# Patient Record
Sex: Female | Born: 2000 | Race: White | Hispanic: No | Marital: Single | Smoking: Never smoker
Health system: Southern US, Community
[De-identification: ages and names within clinical notes are randomized; demographics above are authoritative.]

## PROBLEM LIST (undated history)

## (undated) DIAGNOSIS — K5090 Crohn's disease, unspecified, without complications: Secondary | ICD-10-CM

---

## 2019-12-10 ENCOUNTER — Emergency Department (HOSPITAL_BASED_OUTPATIENT_CLINIC_OR_DEPARTMENT_OTHER)
Admission: EM | Admit: 2019-12-10 | Discharge: 2019-12-10 | Disposition: A | Discharge status: Home / Self Care | Payer: Self-pay | Attending: Emergency Medicine | Admitting: Emergency Medicine

## 2019-12-10 ENCOUNTER — Emergency Department (HOSPITAL_BASED_OUTPATIENT_CLINIC_OR_DEPARTMENT_OTHER): Payer: Self-pay

## 2019-12-10 ENCOUNTER — Other Ambulatory Visit: Payer: Self-pay

## 2019-12-10 ENCOUNTER — Encounter (HOSPITAL_BASED_OUTPATIENT_CLINIC_OR_DEPARTMENT_OTHER): Payer: Self-pay | Admitting: *Deleted

## 2019-12-10 DIAGNOSIS — N12 Tubulo-interstitial nephritis, not specified as acute or chronic: Secondary | ICD-10-CM | POA: Insufficient documentation

## 2019-12-10 HISTORY — DX: Crohn's disease, unspecified, without complications: K50.90

## 2019-12-10 LAB — COMPREHENSIVE METABOLIC PANEL
ALT: 17 U/L (ref 0–44)
AST: 18 U/L (ref 15–41)
Albumin: 4.7 g/dL (ref 3.5–5.0)
Alkaline Phosphatase: 61 U/L (ref 38–126)
Anion gap: 15 (ref 5–15)
BUN: 11 mg/dL (ref 6–20)
CO2: 24 mmol/L (ref 22–32)
Calcium: 9.6 mg/dL (ref 8.9–10.3)
Chloride: 97 mmol/L — ABNORMAL LOW (ref 98–111)
Creatinine, Ser: 0.8 mg/dL (ref 0.44–1.00)
GFR calc Af Amer: >60 mL/min (ref >60)
GFR calc non Af Amer: >60 mL/min (ref >60)
Glucose, Bld: 98 mg/dL (ref 70–99)
Potassium: 3.6 mmol/L (ref 3.5–5.1)
Sodium: 136 mmol/L (ref 135–145)
Total Bilirubin: 2 mg/dL — ABNORMAL HIGH (ref 0.3–1.2)
Total Protein: 9 g/dL — ABNORMAL HIGH (ref 6.5–8.1)

## 2019-12-10 LAB — CBC WITH DIFFERENTIAL/PLATELET
Abs Immature Granulocytes: 0.27 10*3/uL — ABNORMAL HIGH (ref 0.00–0.07)
Basophils Absolute: 0.1 10*3/uL (ref 0.0–0.1)
Basophils Relative: 0 %
Eosinophils Absolute: 0 10*3/uL (ref 0.0–0.5)
Eosinophils Relative: 0 %
HCT: 43.4 % (ref 36.0–46.0)
Hemoglobin: 14.7 g/dL (ref 12.0–15.0)
Immature Granulocytes: 1 %
Lymphocytes Relative: 2 %
Lymphs Abs: 0.6 10*3/uL — ABNORMAL LOW (ref 0.7–4.0)
MCH: 29.5 pg (ref 26.0–34.0)
MCHC: 33.9 g/dL (ref 30.0–36.0)
MCV: 87.1 fL (ref 80.0–100.0)
Monocytes Absolute: 1.5 10*3/uL — ABNORMAL HIGH (ref 0.1–1.0)
Monocytes Relative: 6 %
Neutro Abs: 22.7 10*3/uL — ABNORMAL HIGH (ref 1.7–7.7)
Neutrophils Relative %: 91 %
Platelets: 264 10*3/uL (ref 150–400)
RBC: 4.98 MIL/uL (ref 3.87–5.11)
RDW: 11.9 % (ref 11.5–15.5)
Smear Review: NORMAL
WBC: 25.1 10*3/uL — ABNORMAL HIGH (ref 4.0–10.5)
nRBC: 0 % (ref 0.0–0.2)

## 2019-12-10 LAB — URINALYSIS, ROUTINE W REFLEX MICROSCOPIC
Bilirubin Urine: NEGATIVE
Glucose, UA: NEGATIVE mg/dL
Ketones, ur: >80 mg/dL — AB
Nitrite: POSITIVE — AB
Protein, ur: 30 mg/dL — AB
Specific Gravity, Urine: 1.015 (ref 1.005–1.030)
pH: 5.5 (ref 5.0–8.0)

## 2019-12-10 LAB — URINALYSIS, MICROSCOPIC (REFLEX): WBC, UA: >50 WBC/hpf (ref 0–5)

## 2019-12-10 LAB — RAPID URINE DRUG SCREEN, HOSP PERFORMED
Amphetamines: NOT DETECTED
Barbiturates: NOT DETECTED
Benzodiazepines: NOT DETECTED
Cocaine: NOT DETECTED
Opiates: NOT DETECTED
Tetrahydrocannabinol: POSITIVE — AB

## 2019-12-10 LAB — PREGNANCY, URINE: Preg Test, Ur: NEGATIVE

## 2019-12-10 MED ORDER — ONDANSETRON HCL 4 MG/2ML IJ SOLN
4.0000 mg | Freq: Once | INTRAMUSCULAR | Status: AC
  Administered 2019-12-10: 4 mg via INTRAVENOUS
  Filled 2019-12-10: qty 2

## 2019-12-10 MED ORDER — CEPHALEXIN 500 MG PO CAPS: 500.0000 mg | ORAL_CAPSULE | Freq: Three times a day (TID) | ORAL | 0 refills | Status: AC

## 2019-12-10 MED ORDER — HALOPERIDOL LACTATE 5 MG/ML IJ SOLN
5.0000 mg | Freq: Once | INTRAMUSCULAR | Status: AC
  Administered 2019-12-10: 5 mg via INTRAVENOUS
  Filled 2019-12-10: qty 1

## 2019-12-10 MED ORDER — SODIUM CHLORIDE 0.9 % IV SOLN
1.0000 g | Freq: Once | INTRAVENOUS | Status: AC
  Administered 2019-12-10: 1 g via INTRAVENOUS
  Filled 2019-12-10: qty 10

## 2019-12-10 MED ORDER — IOHEXOL 300 MG/ML  SOLN
100.0000 mL | Freq: Once | INTRAMUSCULAR | Status: AC | PRN
  Administered 2019-12-10: 100 mL via INTRAVENOUS

## 2019-12-10 MED ORDER — SODIUM CHLORIDE 0.9 % IV BOLUS
1000.0000 mL | Freq: Once | INTRAVENOUS | Status: AC
  Administered 2019-12-10: 1000 mL via INTRAVENOUS

## 2019-12-10 MED ORDER — ONDANSETRON 4 MG PO TBDP: 4.0000 mg | ORAL_TABLET | Freq: Three times a day (TID) | ORAL | 0 refills | Status: AC | PRN

## 2019-12-10 MED FILL — CEPHALEXIN 500 MG CAPSULE: 500 | 7 days supply | Qty: 21 | Fill #0

## 2019-12-10 MED FILL — ONDANSETRON ODT 4 MG TABLET: 4 | 7 days supply | Qty: 20 | Fill #0

## 2019-12-10 NOTE — ED Notes (Signed)
Pt received 100 mcg of fentanyl iv pta

## 2019-12-10 NOTE — ED Provider Notes (Signed)
[facility] EMERGENCY DEPARTMENT Provider Note  CSN: [id number] Arrival date & time: 12/10/19 0630    History Chief Complaint  Patient presents with  . Abdominal Pain  . Nausea  . Vomiting    HPI  Rebecca Todd is a 19 y.o. female with a reported history of crohn's disease, not currently taking any medications for same, reports 3 days of sharp abdominal pain and persistent vomiting. She went to [facility] ED last night where labs were drawn but she left prior to seeing a provider. She went to a different ED, she isn't sure which, but left before anything was done there. Symptoms have continued through the night. She is from [place] and has had several previous visits there for same over the last couple of years, CT scans done there have not demonstrated findings consistent with Crohn's. She admits to frequent marijuana use.    Past Medical History:  Diagnosis Date  . Crohn's disease (HCC)     History reviewed. No pertinent surgical history.  History reviewed. No pertinent family history.  Social History   Tobacco Use  . Smoking status: Never Smoker  . Smokeless tobacco: Never Used  Vaping Use  . Vaping Use: Every day  Substance Use Topics  . Alcohol use: Never  . Drug use: Yes    Types: Marijuana    Comment: last used-yesterday     Home Medications Prior to Admission medications   Medication Sig Start Date End Date Taking? Authorizing Provider  cephALEXin (KEFLEX) 500 MG capsule Take 1 capsule (500 mg total) by mouth 3 (three) times daily for 7 days. 12/10/19 12/17/19  Pollyann Savoy, MD  ondansetron (ZOFRAN ODT) 4 MG disintegrating tablet Take 1 tablet (4 mg total) by mouth every 8 (eight) hours as needed for nausea or vomiting. 12/10/19   Pollyann Savoy, MD     Allergies    Penicillins   Review of Systems   Review of Systems A comprehensive review of systems was completed and negative except as noted in HPI.    Physical Exam BP 114/86 (BP  Location: Left Arm)   Pulse (!) 108   Temp 98.1 F (36.7 C) (Oral)   Resp 17   Ht 5' (1.524 m)   Wt 61.2 kg   LMP 12/03/2019   SpO2 96%   BMI 26.37 kg/m   Physical Exam Vitals and nursing note reviewed.  Constitutional:      Appearance: Normal appearance.  HENT:     Head: Normocephalic and atraumatic.     Nose: Nose normal.     Mouth/Throat:     Mouth: Mucous membranes are moist.  Eyes:     Extraocular Movements: Extraocular movements intact.     Conjunctiva/sclera: Conjunctivae normal.  Cardiovascular:     Rate and Rhythm: Normal rate.  Pulmonary:     Effort: Pulmonary effort is normal.     Breath sounds: Normal breath sounds.  Abdominal:     General: Abdomen is flat.     Palpations: Abdomen is soft.     Tenderness: There is generalized abdominal tenderness. There is no guarding. Negative signs include Murphy's sign and McBurney's sign.  Musculoskeletal:        General: No swelling. Normal range of motion.     Cervical back: Neck supple.  Skin:    General: Skin is warm and dry.  Neurological:     General: No focal deficit present.     Mental Status: She is alert.  Psychiatric:  Mood and Affect: Mood normal.      ED Results / Procedures / Treatments   Labs (all labs ordered are listed, but only abnormal results are displayed) Labs Reviewed  COMPREHENSIVE METABOLIC PANEL - Abnormal; Notable for the following components:      Result Value   Chloride 97 (*)    Total Protein 9.0 (*)    Total Bilirubin 2.0 (*)    All other components within normal limits  CBC WITH DIFFERENTIAL/PLATELET - Abnormal; Notable for the following components:   WBC 25.1 (*)    Neutro Abs 22.7 (*)    Lymphs Abs 0.6 (*)    Monocytes Absolute 1.5 (*)    Abs Immature Granulocytes 0.27 (*)    All other components within normal limits  URINALYSIS, ROUTINE W REFLEX MICROSCOPIC - Abnormal; Notable for the following components:   APPearance HAZY (*)    Hgb urine dipstick SMALL (*)      Ketones, ur >80 (*)    Protein, ur 30 (*)    Nitrite POSITIVE (*)    Leukocytes,Ua MODERATE (*)    All other components within normal limits  RAPID URINE DRUG SCREEN, HOSP PERFORMED - Abnormal; Notable for the following components:   Tetrahydrocannabinol POSITIVE (*)    All other components within normal limits  URINALYSIS, MICROSCOPIC (REFLEX) - Abnormal; Notable for the following components:   Bacteria, UA MANY (*)    All other components within normal limits  URINE CULTURE  PREGNANCY, URINE    EKG None   Radiology CT Abdomen Pelvis W Contrast  Result Date: 12/10/2019 CLINICAL DATA:  Right lower quadrant pain, history of Crohn's EXAM: CT ABDOMEN AND PELVIS WITH CONTRAST TECHNIQUE: Multidetector CT imaging of the abdomen and pelvis was performed using the standard protocol following bolus administration of intravenous contrast. CONTRAST:  OMNIPAQUE IOHEXOL 300 MG/ML  SOLN COMPARISON:  None. FINDINGS: Lower chest: No acute abnormality. Hepatobiliary: No focal liver abnormality is seen. No gallstones, gallbladder wall thickening, or biliary dilatation. Pancreas: Unremarkable. Spleen: Unremarkable. Adrenals/Urinary Tract: Adrenals are unremarkable. There is a wedge-shaped area of hypoenhancement at the upper pole the right kidney. Additional smaller area is likely present at the lower pole. Left kidney appears unremarkable. Mild right ureteral wall enhancement and slight prominence. Mild circumferential bladder wall thickening. There is a urachal remnant or sinus. Stomach/Bowel: Bowel is normal in caliber. Mild wall thickening along the terminal ileum and segments of the colon may be related to reported history of Crohn's or under distension. There is no significant surrounding inflammatory change. Normal appendix. Vascular/Lymphatic: No significant vascular abnormality. No enlarged lymph nodes identified. Reproductive: Uterus and bilateral adnexa are unremarkable. Other: No ascites.   Abdominal wall is unremarkable. Musculoskeletal: No significant or acute osseous finding. IMPRESSION: Areas of abnormal enhancement of the right kidney suspicious for pyelonephritis. Mild right ureteral wall enhancement and slight dilatation likely reflecting ascending infection. There is no obstructing calculus. Mild bladder wall thickening may be on the basis of underdistention or cystitis. A urachal remnant or sinus tract is incidentally noted. Electronically Signed   By: Guadlupe Spanish M.D.   On: 12/10/2019 13:21    Procedures Procedures  Medications Ordered in the ED Medications  ondansetron (ZOFRAN) injection 4 mg (4 mg Intravenous Given 12/10/19 0927)  sodium chloride 0.9 % bolus 1,000 mL (0 mLs Intravenous Stopped 12/10/19 1204)  haloperidol lactate (HALDOL) injection 5 mg (5 mg Intravenous Given 12/10/19 1048)  iohexol (OMNIPAQUE) 300 MG/ML solution 100 mL (100 mLs Intravenous Contrast Given  12/10/19 1255)  cefTRIAXone (ROCEPHIN) 1 g in sodium chloride 0.9 % 100 mL IVPB (1 g Intravenous New Bag/Given 12/10/19 1407)     MDM Rules/Calculators/A&P MDM Will check labs here, suspect Cannabinoid hyperemesis. Haldol for nausea. IVF ordered.  ED Course  I have reviewed the triage vital signs and the nursing notes.  Pertinent labs & imaging results that were available during my care of the patient were reviewed by me and considered in my medical decision making (see chart for details).  Clinical Course as of Dec 09 1453  Fri Dec 10, 2019  1100 WBC is elevated, was 17 at [facility] yesterday. Will need CT imaging to rule out infectious cause.    [CS]  1115 CMP with mildly elevated bilirubin, otherwise unremarkable.    [CS]  1338 UA consistent with infection. CT shows pyelonephritis without signs of other infection. Patient reports she is feeling better, no further nausea. She is sleepy from the Haldol but otherwise feels like she would OK to go home. She will be given a dose of Rocephin and if still  feeling OK, plan discharge with Rx for Keflex pending urine culture.    [CS]    Clinical Course User Index [CS] Pollyann Savoy, MD    Final Clinical Impression(s) / ED Diagnoses Final diagnoses:  Pyelonephritis    Rx / DC Orders ED Discharge Orders         Ordered    cephALEXin (KEFLEX) 500 MG capsule  3 times daily        12/10/19 1453    ondansetron (ZOFRAN ODT) 4 MG disintegrating tablet  Every 8 hours PRN        12/10/19 1453           Pollyann Savoy, MD 12/10/19 1455

## 2019-12-10 NOTE — ED Triage Notes (Signed)
Abdominal pain, nausea and vomiting for 3 days

## 2019-12-10 NOTE — ED Notes (Signed)
ED Provider at bedside. 

## 2019-12-12 LAB — URINE CULTURE: Culture: >=100000 — AB

## 2019-12-13 ENCOUNTER — Telehealth: Payer: Self-pay | Admitting: Emergency Medicine

## 2019-12-13 NOTE — Telephone Encounter (Signed)
Post ED Visit - Positive Culture Follow-up  Culture report reviewed by antimicrobial stewardship pharmacist: [facility] []  , Pharm.D. []  Enzo Bi, .D., BCPS AQ-ID []  Celedonio Miyamoto, Pharm.D., BCPS []  [address], Pharm.D., BCPS []  [place], Garvin Fila.D., BCPS, AAHIVP []  , Pharm.D., BCPS, AAHIVP []  Georgina Pillion, PharmD, BCPS []  , PharmD, BCPS []  [place], PharmD, BCPS []  [address], PharmD []  , PharmD, BCPS []  Estella Husk, PharmD PharmD  [facility] []  , PharmD []  Phillips Climes, PharmD []  , PharmD []  Agapito Games, Rph []  ) Verlan Friends, PharmD []  , PharmD []  Mervyn Gay, PharmD []  , PharmD []  Vinnie Level, PharmD []  Sharen Hones, PharmD []  [facility], PharmD []  , PharmD []  Len Childs, PharmD   Positive urine culture Treated with cephalexin, organism sensitive to the same and no further patient follow-up is required at this time.  12/13/2019, 12:06 PM

## 2019-12-14 MED FILL — CIPROFLOXACIN HCL 500 MG TA: 500 | 7 days supply | Qty: 14 | Fill #0

## 2019-12-14 MED FILL — ONDANSETRON ODT 4 MG TABLET: 4 | 7 days supply | Qty: 20 | Fill #0

## 2019-12-14 NOTE — ED Provider Notes (Signed)
Patient seen on Friday.  Was diagnosed with pyelonephritis.  Received 1 g of Rocephin.  Was supposed to be taking Keflex to follow through with treatment.  Patient shows up at the pharmacy today.  Does not want to take Keflex did not get it filled came to get that filled today.  However she is concerned because of a previous penicillin allergy that she cannot take the Keflex.  Reviewed the urine culture.  E. coli sensitive to Cipro as well.  We will treat the patient with Cipro.  Pregnancy test was negative.  Prescription changed to Cipro 500 mg twice a day for 7 days.  7 days should be adequate at this point in time.   Vanetta Mulders, MD 12/14/19 1755

## 2021-09-10 IMAGING — CT CT ABD-PELV W/ CM
2 of 4 series · 16 of 46 positions shown, 18 images · IV contrast (Omnipaque)
Comparison: None.

CLINICAL DATA: Right lower quadrant pain, history of Crohn's

EXAM:
CT ABDOMEN AND PELVIS WITH CONTRAST
TECHNIQUE: Multidetector CT imaging of the abdomen and pelvis was performed
using the standard protocol following bolus administration of
intravenous contrast.
CONTRAST:  100mL OMNIPAQUE IOHEXOL 300 MG/ML  SOLN

[Series 2: axial st · axial · 0.96mm/px · z∈[-443,-18]mm · 13 of 93 slices shown, 15 images]
[im 4/93  soft-tissue]
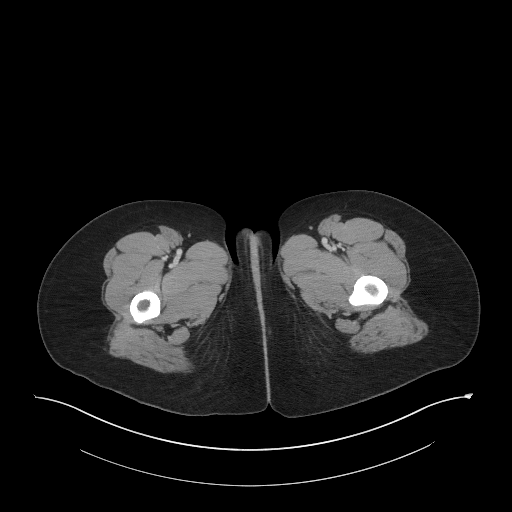
[im 4/93  bone]
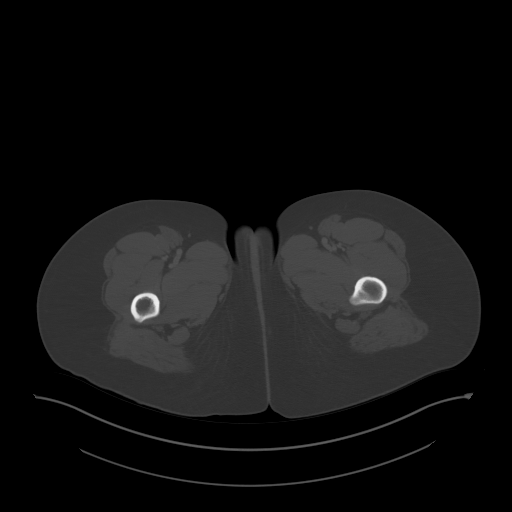
[im 12/93  soft-tissue]
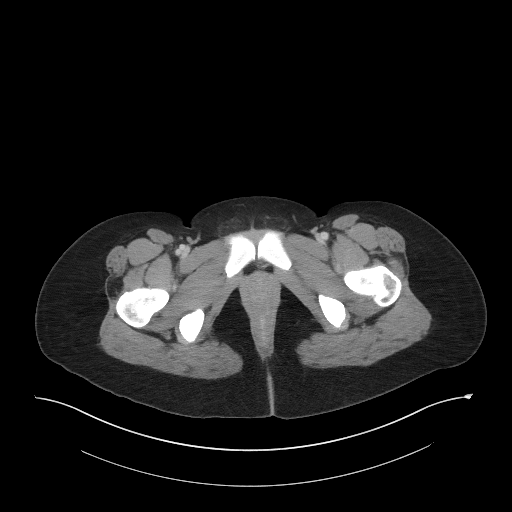
[im 19/93  soft-tissue]
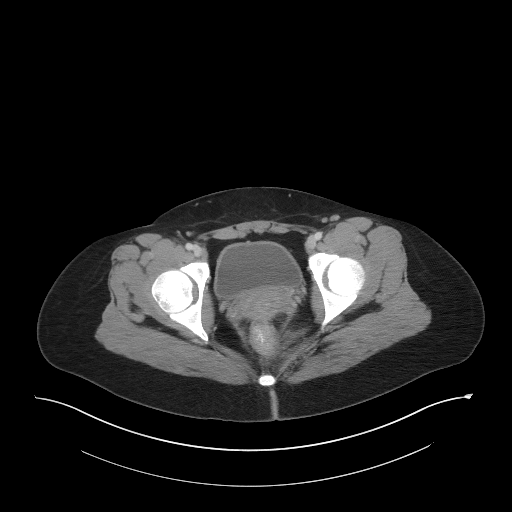
[im 26/93  soft-tissue]
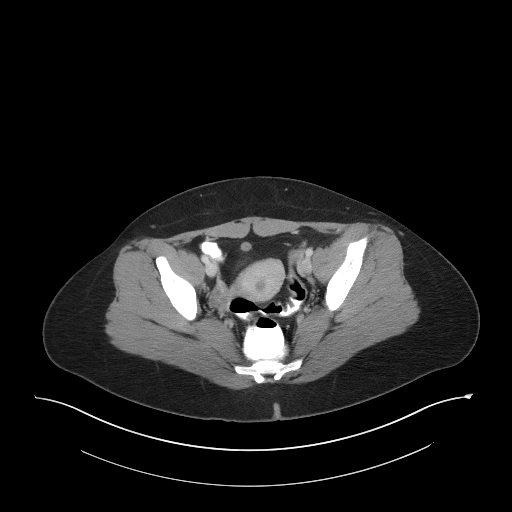
[im 34/93  soft-tissue]
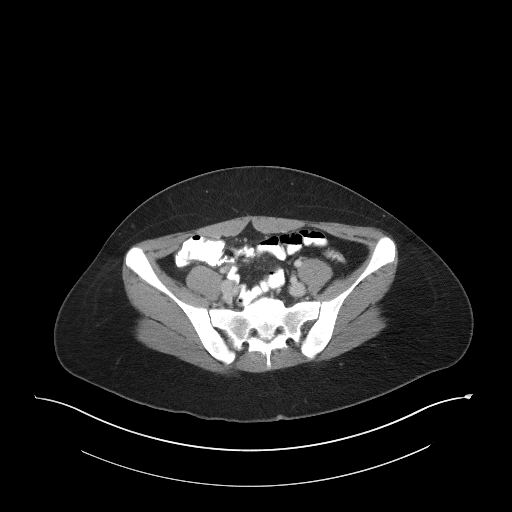
[im 41/93  soft-tissue]
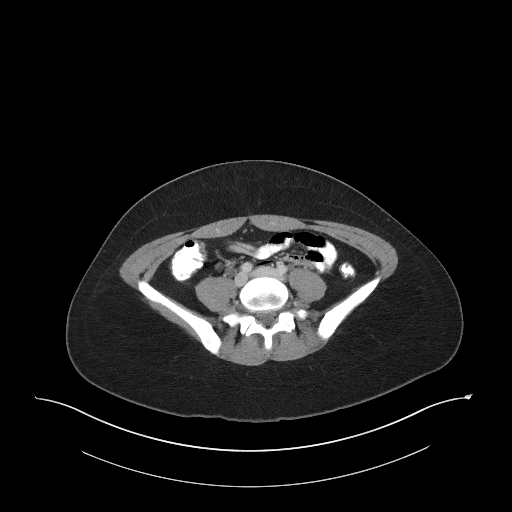
[im 48/93  soft-tissue]
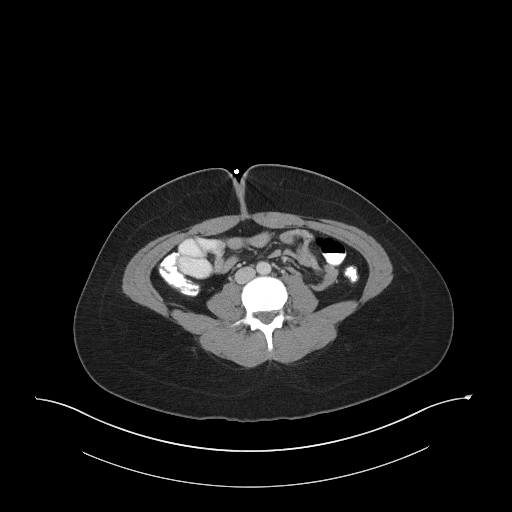
[im 52/93  soft-tissue]
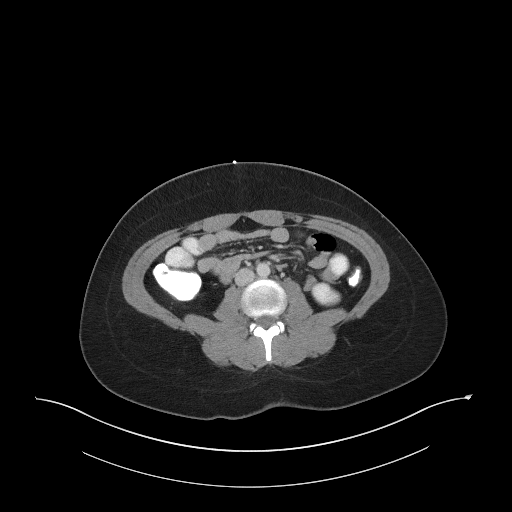
[im 59/93  soft-tissue]
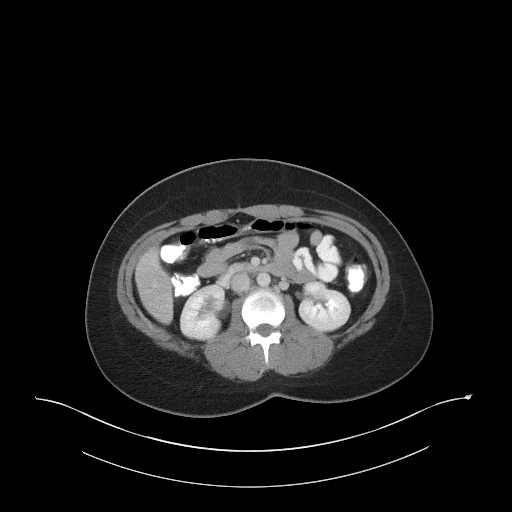
[im 59/93  bone]
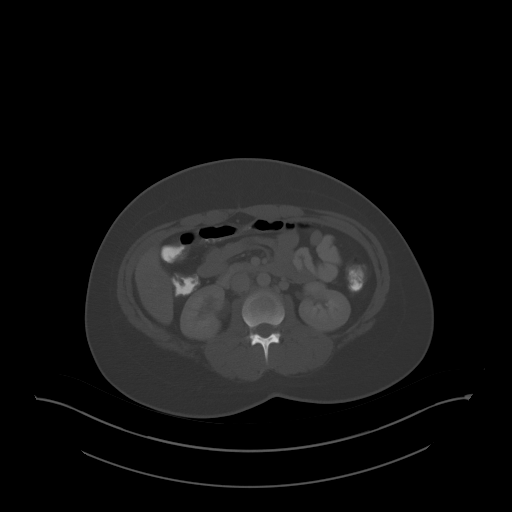
[im 67/93  soft-tissue]
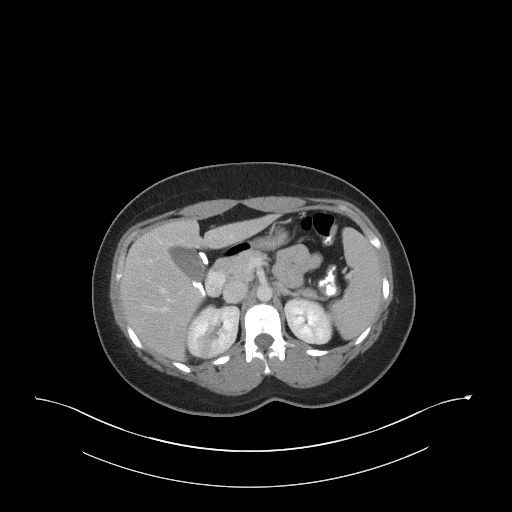
[im 74/93  soft-tissue]
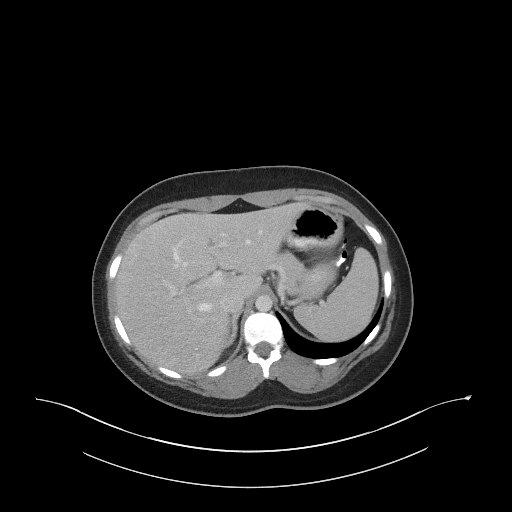
[im 81/93  soft-tissue]
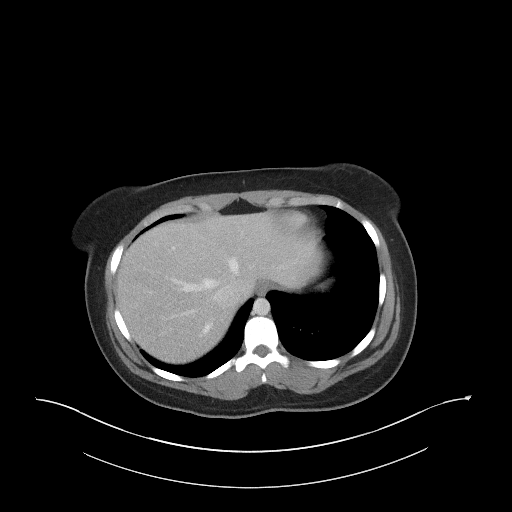
[im 89/93  soft-tissue]
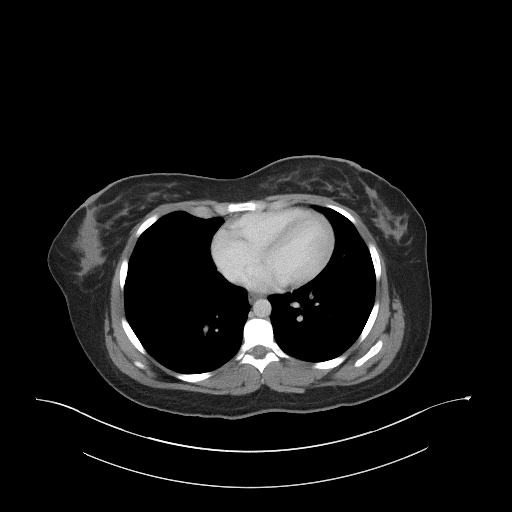

[Series 5: coronal st · coronal · 0.78mm/px · 3 of 83 slices shown]
[im 28/83  soft-tissue]
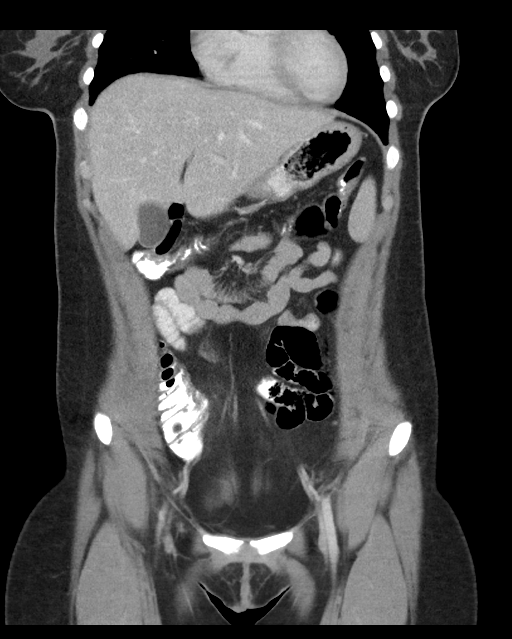
[im 37/83  soft-tissue]
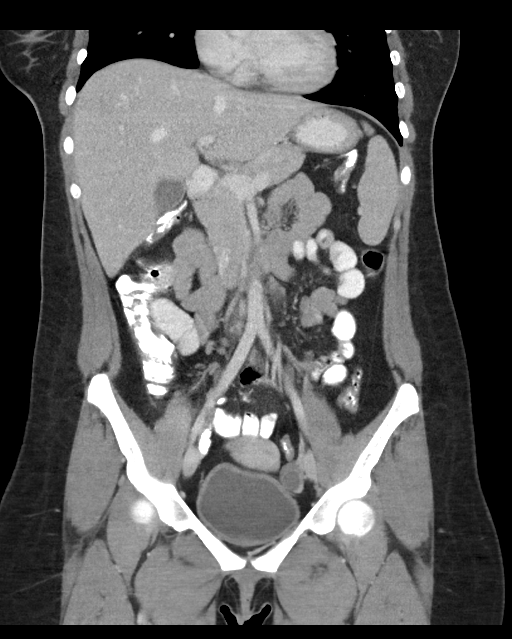
[im 46/83  soft-tissue]
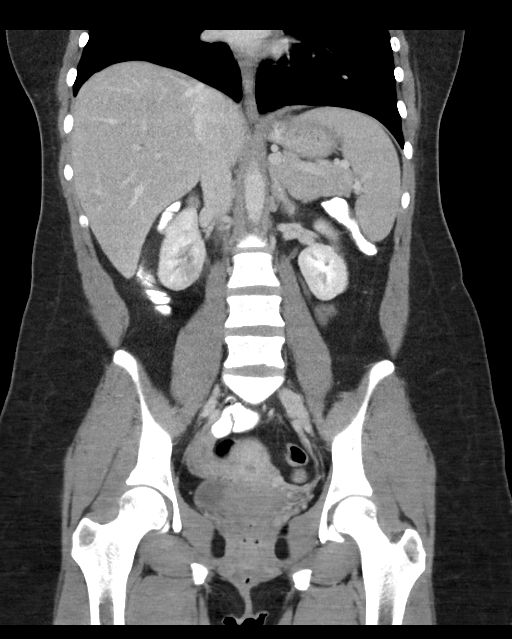

[16 of 46 positions shown; findings below may reference images not displayed]

FINDINGS: Lower chest: No acute abnormality.

Hepatobiliary: No focal liver abnormality is seen. No gallstones,
gallbladder wall thickening, or biliary dilatation.

Pancreas: Unremarkable.

Spleen: Unremarkable.

Adrenals/Urinary Tract: Adrenals are unremarkable. There is a
wedge-shaped area of hypoenhancement at the upper pole the right
kidney. Additional smaller area is likely present at the lower pole.
Left kidney appears unremarkable. Mild right ureteral wall
enhancement and slight prominence. Mild circumferential bladder wall
thickening. There is a urachal remnant or sinus.

Stomach/Bowel: Bowel is normal in caliber. Mild wall thickening
along the terminal ileum and segments of the colon may be related to
reported history of Crohn's or under distension. There is no
significant surrounding inflammatory change. Normal appendix.

Vascular/Lymphatic: No significant vascular abnormality. No enlarged
lymph nodes identified.

Reproductive: Uterus and bilateral adnexa are unremarkable.

Other: No ascites.  Abdominal wall is unremarkable.

Musculoskeletal: No significant or acute osseous finding.
IMPRESSION: Areas of abnormal enhancement of the right kidney suspicious for
pyelonephritis. Mild right ureteral wall enhancement and slight
dilatation likely reflecting ascending infection. There is no
obstructing calculus. Mild bladder wall thickening may be on the
basis of underdistention or cystitis. A urachal remnant or sinus
tract is incidentally noted.
